# Patient Record
Sex: Male | Born: 1970 | Race: White | Hispanic: No | Marital: Married | Smoking: Current every day smoker
Health system: Southern US, Community
[De-identification: ages and names within clinical notes are randomized; demographics above are authoritative.]

## PROBLEM LIST (undated history)

## (undated) DIAGNOSIS — F32A Depression, unspecified: Secondary | ICD-10-CM

## (undated) DIAGNOSIS — E785 Hyperlipidemia, unspecified: Secondary | ICD-10-CM

## (undated) DIAGNOSIS — R42 Dizziness and giddiness: Secondary | ICD-10-CM

## (undated) DIAGNOSIS — F329 Major depressive disorder, single episode, unspecified: Secondary | ICD-10-CM

## (undated) DIAGNOSIS — E291 Testicular hypofunction: Secondary | ICD-10-CM

## (undated) DIAGNOSIS — R9389 Abnormal findings on diagnostic imaging of other specified body structures: Secondary | ICD-10-CM

## (undated) HISTORY — DX: Abnormal findings on diagnostic imaging of other specified body structures: R93.89

## (undated) HISTORY — DX: Hyperlipidemia, unspecified: E78.5

## (undated) HISTORY — DX: Depression, unspecified: F32.A

## (undated) HISTORY — DX: Dizziness and giddiness: R42

## (undated) HISTORY — DX: Testicular hypofunction: E29.1

## (undated) HISTORY — DX: Major depressive disorder, single episode, unspecified: F32.9

---

## 2000-06-27 HISTORY — PX: KNEE SURGERY: SHX244

## 2011-09-20 ENCOUNTER — Encounter: Payer: Self-pay | Admitting: Gastroenterology

## 2011-10-10 ENCOUNTER — Ambulatory Visit (INDEPENDENT_AMBULATORY_CARE_PROVIDER_SITE_OTHER): Payer: 59 | Admitting: Gastroenterology

## 2011-10-10 ENCOUNTER — Encounter: Payer: Self-pay | Admitting: Gastroenterology

## 2011-10-10 ENCOUNTER — Other Ambulatory Visit (INDEPENDENT_AMBULATORY_CARE_PROVIDER_SITE_OTHER): Payer: 59

## 2011-10-10 VITALS — BP 102/80 | HR 88 | Ht 74.0 in | Wt 207.1 lb

## 2011-10-10 DIAGNOSIS — R197 Diarrhea, unspecified: Secondary | ICD-10-CM

## 2011-10-10 LAB — SEDIMENTATION RATE: Sed Rate: 4 mm/hr (ref 0–22)

## 2011-10-10 LAB — COMPREHENSIVE METABOLIC PANEL
Alkaline Phosphatase: 79 U/L (ref 39–117)
BUN: 14 mg/dL (ref 6–23)
CO2: 28 mEq/L (ref 19–32)
Creatinine, Ser: 1 mg/dL (ref 0.4–1.5)
GFR: 92.34 mL/min (ref >60.00)
Glucose, Bld: 97 mg/dL (ref 70–99)
Sodium: 141 mEq/L (ref 135–145)
Total Bilirubin: 0.7 mg/dL (ref 0.3–1.2)
Total Protein: 7.1 g/dL (ref 6.0–8.3)

## 2011-10-10 LAB — CBC WITH DIFFERENTIAL/PLATELET
Basophils Absolute: 0 10*3/uL (ref 0.0–0.1)
Eosinophils Relative: 1.5 % (ref 0.0–5.0)
HCT: 49.6 % (ref 39.0–52.0)
Lymphocytes Relative: 29.4 % (ref 12.0–46.0)
Lymphs Abs: 2.9 10*3/uL (ref 0.7–4.0)
Monocytes Relative: 6.3 % (ref 3.0–12.0)
Neutrophils Relative %: 62.4 % (ref 43.0–77.0)
Platelets: 262 10*3/uL (ref 150.0–400.0)
RDW: 13.7 % (ref 11.5–14.6)
WBC: 9.8 10*3/uL (ref 4.5–10.5)

## 2011-10-10 NOTE — Progress Notes (Signed)
History of Present Illness: This is a 41 year old male relates 4-5 discrete episodes of feeling flushed with itchy skin, sinus congestion, palpitations, shortness of breath, crampy abdominal pain, abdominal bloating and severe watery diarrhea. The episodes began one year ago and have occurred every few months. They always begin in the night, awakening from sleep. The episodes last for 15-30 minutes. With the last episode he had 3 episodes of syncope. In between episodes he is asymptomatic. He presented to his primary provider and is under going evaluation for syncope. Cardiology consult is pending. Denies weight loss, constipation,  change in stool caliber, melena, hematochezia, nausea, vomiting, dysphagia, reflux symptoms, chest pain.  No Known Allergies No outpatient prescriptions prior to visit.   History reviewed. No pertinent past medical history. History reviewed. No pertinent past surgical history. History   Social History  . Marital Status: Married    Spouse Name: N/A    Number of Children: 3  . Years of Education: N/A   Occupational History  . maintenance    Social History Main Topics  . Smoking status: Current Everyday Smoker  . Smokeless tobacco: Never Used  . Alcohol Use: Yes     social  . Drug Use: No  . Sexually Active: None   Other Topics Concern  . None   Social History Narrative  . None   Family History  Problem Relation Age of Onset  . Diverticulosis Father   . Heart disease Father   . Gallbladder disease Father    Review of Systems: Pertinent positive and negative review of systems were noted in the above HPI section. All other review of systems were otherwise negative.  Physical Exam: General: Well developed , well nourished, no acute distress Head: Normocephalic and atraumatic Eyes:  sclerae anicteric, EOMI Ears: Normal auditory acuity Mouth: No deformity or lesions Neck: Supple, no masses or thyromegaly Lungs: Clear throughout to  auscultation Heart: Regular rate and rhythm; no murmurs, rubs or bruits Abdomen: Soft, non tender and non distended. No masses, hepatosplenomegaly or hernias noted. Normal Bowel sounds Rectal: Deferred. Musculoskeletal: Symmetrical with no gross deformities  Skin: No lesions on visible extremities Pulses:  Normal pulses noted Extremities: No clubbing, cyanosis, edema or deformities noted Neurological: Alert oriented x 4, grossly nonfocal Cervical Nodes:  No significant cervical adenopathy Inguinal Nodes: No significant inguinal adenopathy Psychological:  Alert and cooperative. Normal mood and affect  Assessment and Recommendations:  1. Rule out angioedema and carcinoid syndrome. Obtain standard blood and urine tests for both disorders. Obtain stool Hemoccults, CBC, CMET, ESR, TSH. Return office visit in 4 weeks.

## 2011-10-10 NOTE — Patient Instructions (Addendum)
You have been given a separate informational sheet regarding your tobacco use, the importance of quitting and local resources to help you quit.  Your physician has requested that you go to the basement for the following lab work before leaving today: C1 Esterase, 5 HIAA quantitative urine, CBC, CMET, TSH, Sedimentation Rate.  Please follow instructions on Hemoccult cards and mail back to [place] when finished.   cc: Johna Sheriff, PA

## 2011-10-12 ENCOUNTER — Institutional Professional Consult (permissible substitution): Payer: Self-pay | Admitting: Cardiovascular Disease

## 2011-10-13 LAB — C1 ESTERASE INHIBITOR, FUNCTIONAL: C1INH Functional/C1INH Total MFr SerPl: >100 % (ref >=68)

## 2013-01-31 ENCOUNTER — Encounter: Payer: 59 | Admitting: Family Medicine

## 2017-07-20 ENCOUNTER — Other Ambulatory Visit: Payer: Self-pay | Admitting: Physician Assistant

## 2017-07-20 DIAGNOSIS — H814 Vertigo of central origin: Secondary | ICD-10-CM

## 2017-07-28 ENCOUNTER — Ambulatory Visit
Admission: RE | Admit: 2017-07-28 | Discharge: 2017-07-28 | Disposition: A | Discharge status: Home / Self Care | Payer: 59 | Source: Ambulatory Visit | Attending: Physician Assistant | Admitting: Physician Assistant

## 2017-07-28 DIAGNOSIS — H814 Vertigo of central origin: Secondary | ICD-10-CM

## 2017-07-28 MED ORDER — GADOBENATE DIMEGLUMINE 529 MG/ML IV SOLN
19.0000 mL | Freq: Once | INTRAVENOUS | Status: AC | PRN
  Administered 2017-07-28: 19 mL via INTRAVENOUS

## 2017-08-21 ENCOUNTER — Ambulatory Visit: Payer: 59 | Admitting: Neurology

## 2017-08-21 ENCOUNTER — Telehealth: Payer: Self-pay | Admitting: *Deleted

## 2017-08-21 NOTE — Telephone Encounter (Signed)
No showed new patient appointment.

## 2017-08-22 ENCOUNTER — Encounter: Payer: Self-pay | Admitting: Neurology

## 2017-10-12 ENCOUNTER — Encounter: Payer: Self-pay | Admitting: Neurology

## 2017-10-12 ENCOUNTER — Ambulatory Visit (INDEPENDENT_AMBULATORY_CARE_PROVIDER_SITE_OTHER): Payer: 59 | Admitting: Neurology

## 2017-10-12 VITALS — BP 143/93 | HR 87 | Ht 73.5 in | Wt 206.0 lb

## 2017-10-12 DIAGNOSIS — R9089 Other abnormal findings on diagnostic imaging of central nervous system: Secondary | ICD-10-CM | POA: Diagnosis not present

## 2017-10-12 DIAGNOSIS — R42 Dizziness and giddiness: Secondary | ICD-10-CM | POA: Insufficient documentation

## 2017-10-12 NOTE — Progress Notes (Signed)
PATIENT: Scott Williams DOB: 1971-04-06  Chief Complaint  Patient presents with  . Abnormal MRI brain    He is here to discuss past MRI findings of a lesion in his left temporal lobe.  He has had three episodes of vertigo that lasted several days each (05/2016, summer 2018, 06/2017).  Two events lasted several days and was resolved with steroid injections. One episode resolved with chiropractic care.  He feels some of these symptoms were surrounding the increased stress he was under at the time.  He now has his stress under control and has not had any further issues.  Scott Williams PCP    Scott Cage, PA     HISTORICAL  Scott Williams is a 47 year old male seen in refer by his primary care.  Scott Williams for evaluation of recurrent vertigo, review MRI findings, initial evaluation was on October 12, 2017  I reviewed and summarized the referring note, he had a history of depression, hyperlipidemia, allergy,  From 2010 to  2016, he had recurrent episodes of sudden onset panic sensation, often followed by the urge to have bowel movement, there was also 2 episode with transient loss of consciousness, there was one episode was witnessed by his wife, that seizure-like activity.  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  He had no recurrent spells since 2016, but since December 2017, he has different kind of spells, presented with sudden onset vertigo, dizziness, sometimes nausea, this can last for a few hours, few days, most recent flareup was in January 2019, no loss of consciousness, no hearing loss,   Laboratory  evaluations in May 2018 normal CMP, creatinine of 0.9, elevated LDL 177, cholesterol of 241, normal CBC, hemoglobin of 17.4,  REVIEW OF SYSTEMS: Full 14 system review of systems performed and notable only for as above  ALLERGIES: No Known Allergies  HOME MEDICATIONS: Current Outpatient Medications  Medication Sig Dispense Refill  . PARoxetine (PAXIL) 20 MG tablet Take 1 tablet by mouth daily.     No current facility-administered medications for this visit.     PAST MEDICAL HISTORY: Past Medical History:  Diagnosis Date  . Abnormal MRI   . Depression   . Hyperlipidemia   . Hypogonadism in male   . Vertigo     PAST SURGICAL HISTORY: Past Surgical History:  Procedure Laterality Date  . KNEE SURGERY Right 2002    FAMILY HISTORY: Family History  Problem Relation Age of Onset  . Diverticulosis Father   . Heart disease Father   . Gallbladder disease Father        died from sepsis  . Healthy Mother     SOCIAL HISTORY:  Social History   Socioeconomic History  . Marital status: Married    Spouse name: Not on file  . Number of children: 3  . Years of education: some college  . Highest education level: Not on file  Occupational History  . Occupation: maintenance  Social Needs  . Financial resource strain: Not on file  . Food insecurity:    Worry: Not on file    Inability: Not on file  . Transportation needs:    Medical:  Not on file    Non-medical: Not on file  Tobacco Use  . Smoking status: Current Every Day Smoker    Packs/day: 0.75    Types: Cigarettes  . Smokeless tobacco: Never Used  Substance and Sexual Activity  . Alcohol use: Yes    Comment: social  . Drug use: No  . Sexual activity: Not on file  Lifestyle  . Physical activity:    Days per week: Not on file    Minutes per session: Not on file  . Stress: Not on file  Relationships  . Social connections:    Talks on phone: Not on file    Gets together: Not on file    Attends religious service:  Not on file    Active member of club or organization: Not on file    Attends meetings of clubs or organizations: Not on file    Relationship status: Not on file  . Intimate partner violence:    Fear of current or ex partner: Not on file    Emotionally abused: Not on file    Physically abused: Not on file    Forced sexual activity: Not on file  Other Topics Concern  . Not on file  Social History Narrative   Lives at home with his wife and children.   Right-handed.   Caffeine: 2-3 cups per day.     PHYSICAL EXAM   Vitals:   10/12/17 0834  BP: (!) 143/93  Pulse: 87  Weight: 206 lb (93.4 kg)  Height: 6' 1.5" (1.867 m)    Not recorded      Body mass index is 26.81 kg/m.  PHYSICAL EXAMNIATION:  Gen: NAD, conversant, well nourised, obese, well groomed                     Cardiovascular: Regular rate rhythm, no peripheral edema, warm, nontender. Eyes: Conjunctivae clear without exudates or hemorrhage Neck: Supple, no carotid bruits. Pulmonary: Clear to auscultation bilaterally   NEUROLOGICAL EXAM:  MENTAL STATUS: Speech:    Speech is normal; fluent and spontaneous with normal comprehension.  Cognition:     Orientation to time, place and person     Normal recent and remote memory     Normal Attention span and concentration     Normal Language, naming, repeating,spontaneous speech     Fund of knowledge   CRANIAL NERVES: CN II: Visual fields are full to confrontation. Fundoscopic exam is normal with sharp discs and no vascular changes. Pupils are round equal and briskly reactive to light. CN III, IV, VI: extraocular movement are normal. No ptosis. CN V: Facial sensation is intact to pinprick in all 3 divisions bilaterally. Corneal responses are intact.  CN VII: Face is symmetric with normal eye closure and smile. CN VIII: Hearing is normal to rubbing fingers CN IX, X: Palate elevates symmetrically. Phonation is normal. CN XI: Head turning and shoulder shrug are  intact CN XII: Tongue is midline with normal movements and no atrophy.  MOTOR: There is no pronator drift of out-stretched arms. Muscle bulk and tone are normal. Muscle strength is normal.  REFLEXES: Reflexes are 2+ and symmetric at the biceps, triceps, knees, and ankles. Plantar responses are flexor.  SENSORY: Intact to light touch, pinprick, positional sensation and vibratory sensation are intact in fingers and toes.  COORDINATION: Rapid alternating movements and fine finger movements are intact. There is no dysmetria on finger-to-nose and heel-knee-shin.    GAIT/STANCE: Posture is normal. Gait is steady  with normal steps, base, arm swing, and turning. Heel and toe walking are normal. Tandem gait is normal.  Romberg is absent.   DIAGNOSTIC DATA (LABS, IMAGING, TESTING) - I reviewed patient records, labs, notes, testing and imaging myself where available.   ASSESSMENT AND PLAN  Mckennon Zwart is a 47 y.o. 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 Ph.D.  [facility] [address] [place], [place] [address] Ph: [phone] Fax: [phone]  CC: Scott Williams, [place]

## 2017-10-12 NOTE — Patient Instructions (Signed)
Lamotrigine

## 2019-01-30 IMAGING — MR MR HEAD WO/W CM
14 series · 48 of 48 positions shown · IV contrast (multihance)
Comparison: No prior imaging is available for comparison.

CLINICAL DATA: Vertigo of central origin, unspecified laterality.

EXAM:
MRI HEAD WITHOUT AND WITH CONTRAST
TECHNIQUE: Multiplanar, multiecho pulse sequences of the brain and surrounding
structures were obtained without and with intravenous contrast.
CONTRAST:  19mL MULTIHANCE GADOBENATE DIMEGLUMINE 529 MG/ML IV SOLN

[Series 2: ep2d_diff_(id)_trace · axial · 3.0mm · 1.88mm/px · z∈[-68,+92]mm · 7 of 107 slices shown]
[im 1/107]
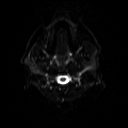
[im 18/107]
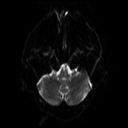
[im 36/107]
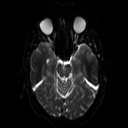
[im 54/107]
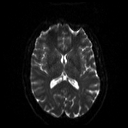
[im 71/107]
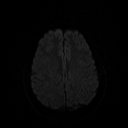
[im 89/107]
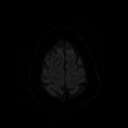
[im 107/107]
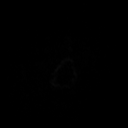

[Series 3: ep2d_diff_(id)_trace_adc · axial · 3.0mm · 1.88mm/px · z∈[-68,+92]mm · 3 of 55 slices shown]
[im 1/55]
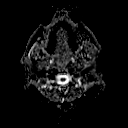
[im 28/55]
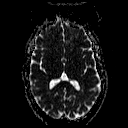
[im 55/55]
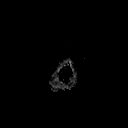

[Series 4: FLAIR · axial · 3.0mm · 0.47mm/px · z∈[-69,+93]mm · 2 of 35 slices shown]
[im 1/35]
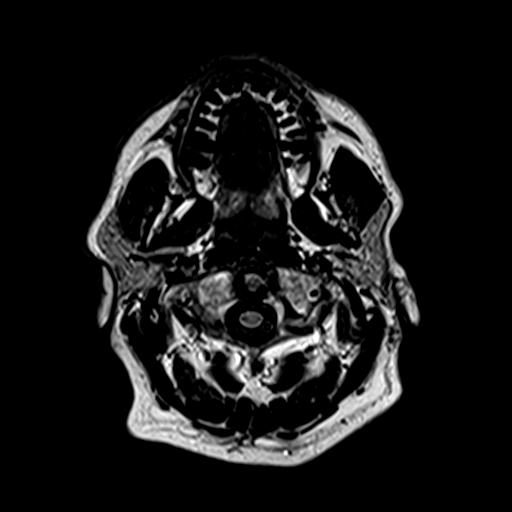
[im 35/35]
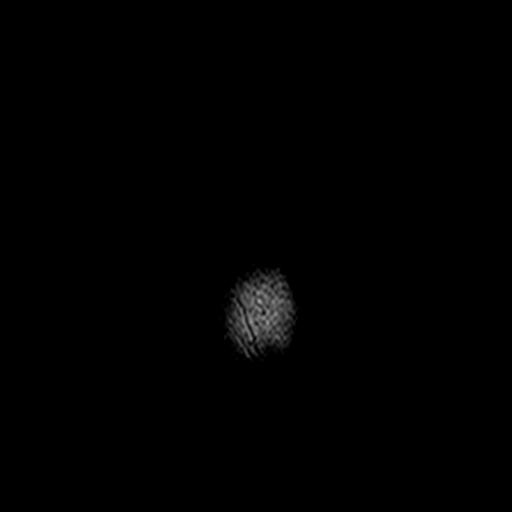

[Series 5: t2_tse_tra · axial · 5.0mm · 0.62mm/px · 1 of 28 slices shown]
[im 1/28]
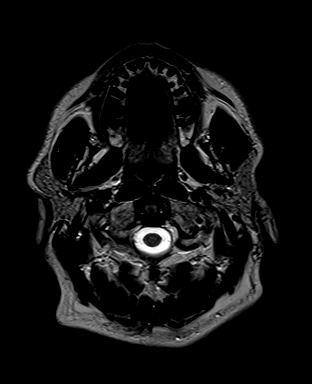

[Series 6: mip_images(sw) · axial · 16.0mm · 0.94mm/px · z∈[-58,+85]mm · 4 of 73 slices shown]
[im 1/73]
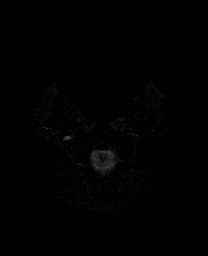
[im 25/73]
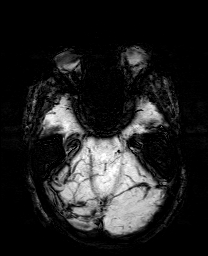
[im 49/73]
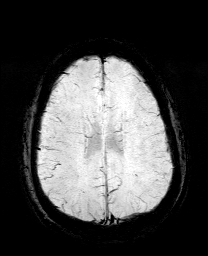
[im 73/73]
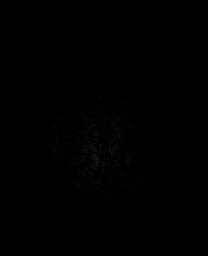

[Series 7: swi_images · axial · 2.0mm · 0.94mm/px · z∈[-65,+92]mm · 4 of 80 slices shown]
[im 1/80]
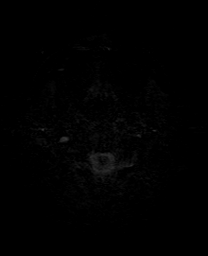
[im 27/80]
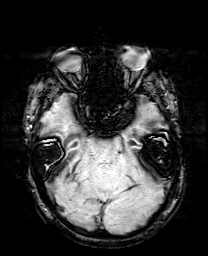
[im 53/80]
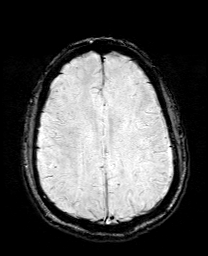
[im 80/80]
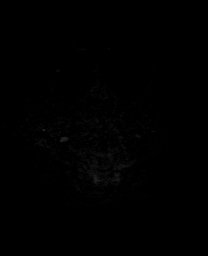

[Series 8: t1_mpr_tra · axial · 1.0mm · 0.75mm/px · z∈[-66,+92]mm · 8 of 160 slices shown]
[im 1/160]
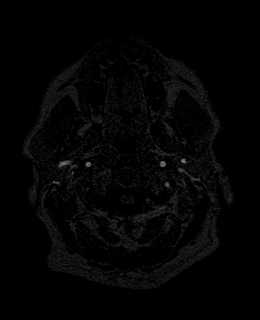
[im 23/160]
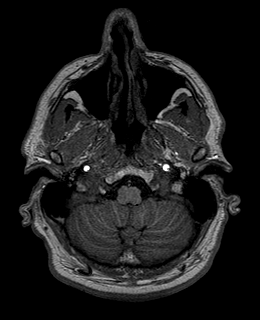
[im 46/160]
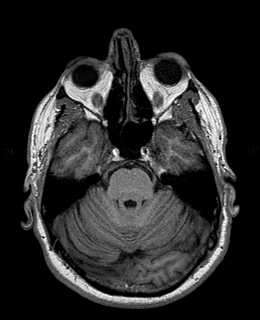
[im 69/160]
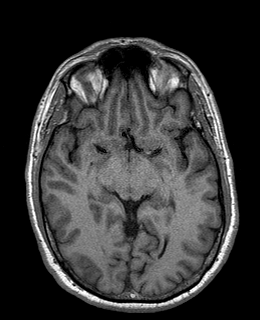
[im 91/160]
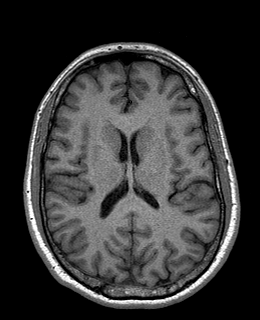
[im 114/160]
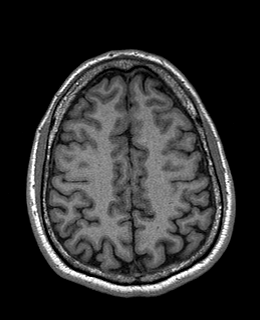
[im 137/160]
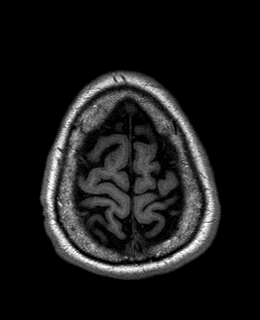
[im 160/160]
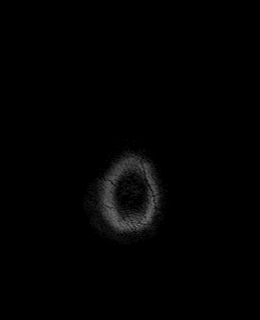

[Series 9: ep2d_diff_cor · coronal · 5.0mm · 1.77mm/px · 3 of 65 slices shown]
[im 1/65]
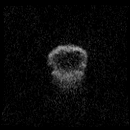
[im 33/65]
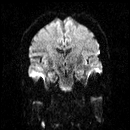
[im 65/65]
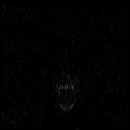

[Series 10: ep2d_diff_cor_adc · coronal · 5.0mm · 1.77mm/px · 2 of 33 slices shown]
[im 1/33]
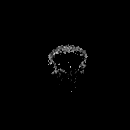
[im 33/33]
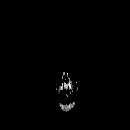

[Series 11: t1_se_sag · sagittal · 5.0mm · 0.47mm/px · 1 of 23 slices shown]
[im 1/23]
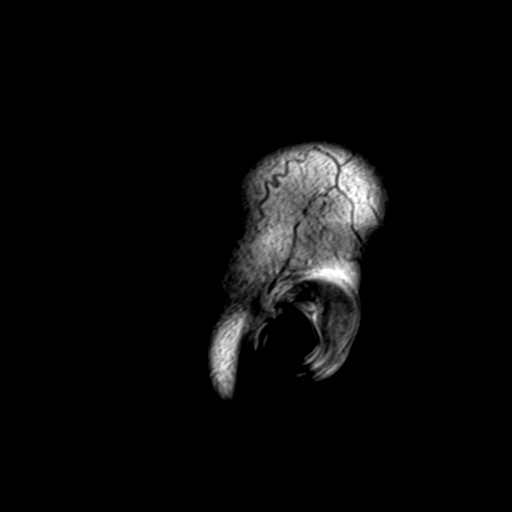

[Series 12: T2 post-contrast · coronal · 5.0mm · 0.45mm/px · 2 of 32 slices shown]
[im 1/32]
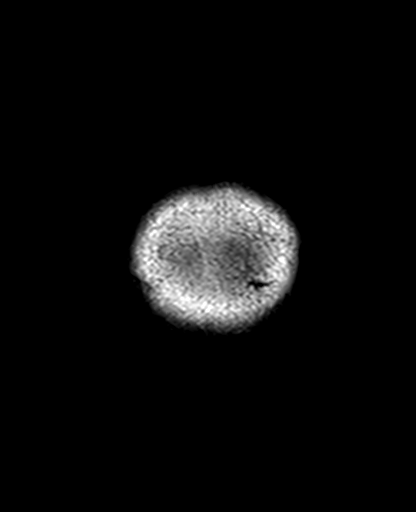
[im 32/32]
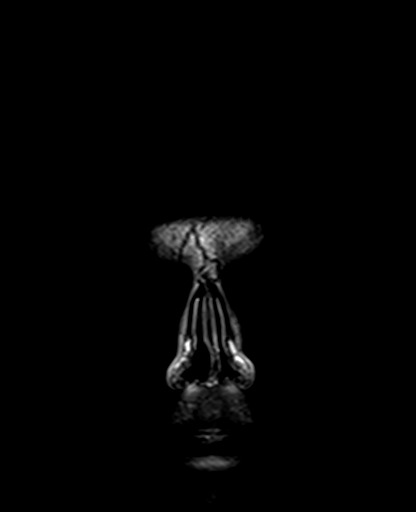

[Series 13: post t1_mpr_tra · axial · 1.0mm · 0.75mm/px · z∈[-66,+92]mm · 8 of 160 slices shown]
[im 1/160]
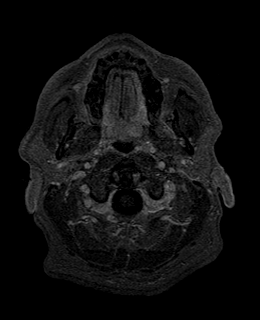
[im 23/160]
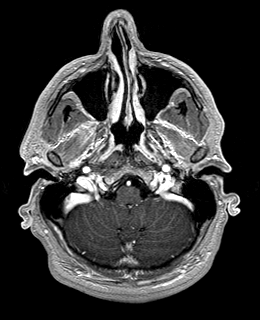
[im 46/160]
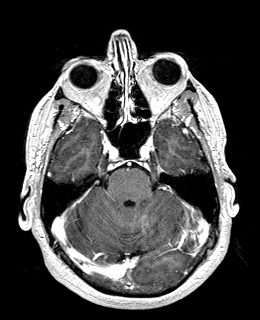
[im 69/160]
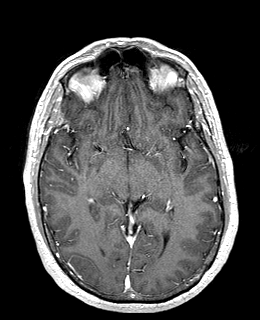
[im 91/160]
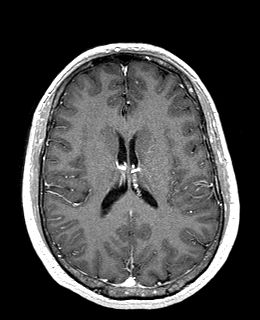
[im 114/160]
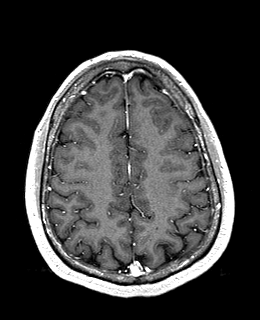
[im 137/160]
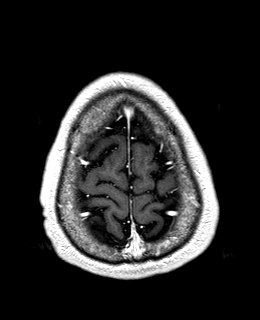
[im 160/160]
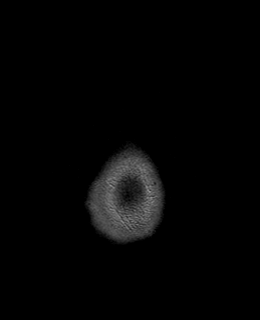

[Series 14: T1 post-contrast · coronal · 5.0mm · 0.72mm/px · 2 of 32 slices shown]
[im 1/32]
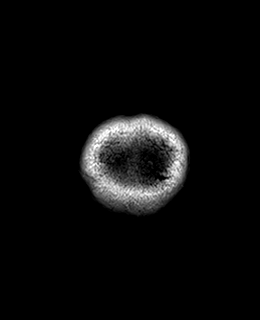
[im 32/32]
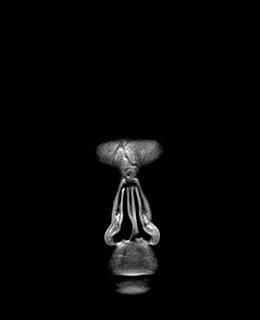

[Series 15: t1_se_sag post · sagittal · 5.0mm · 0.47mm/px · 1 of 23 slices shown]
[im 1/23]
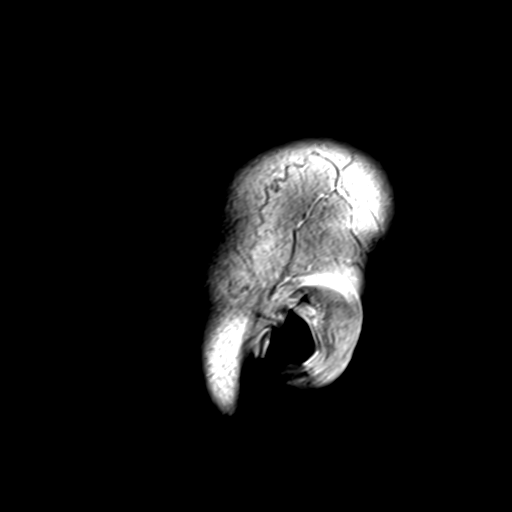

[48 of 48 positions shown; findings below may reference images not displayed]

FINDINGS: Brain: No evidence for acute stroke, acute hemorrhage,
hydrocephalus, or extra-axial fluid. Normal for age cerebral volume.

In the mesial LEFT temporal lobe, there is a solitary 5 mm area of
T2 and FLAIR hyperintensity, fairly well-circumscribed, no
restriction, without surrounding edema, visible
calcification/susceptibility/hemorrhage, or enlargement/atrophy of
the uncus/hippocampus. Post infusion, the lesion does not enhance.
The lesion is seen best on image 11 series 5 T2 axial.

The brain is otherwise normal.

Vascular: Normal flow voids.

Skull and upper cervical spine: Normal marrow signal.

Sinuses/Orbits: Negative.

Other: None.
IMPRESSION: Solitary 5 mm lesion in the mesial LEFT temporal lobe without
surrounding edema or enhancement. Given interval stability since
8733, the lesion is likely benign, possibly a hamartoma.

In the ordering provider's nodes, he states that there is a prior
MRI, which described this abnormality. Neither those images nor the
report are available, but an addendum could be placed if the images
were obtained.

Otherwise negative MRI brain without with contrast.
